# Patient Record
Sex: Female | Born: 1950 | Race: White | Hispanic: No | Marital: Single | Smoking: Never smoker
Health system: Southern US, Community
[De-identification: ages and names within clinical notes are randomized; demographics above are authoritative.]

## PROBLEM LIST (undated history)

## (undated) DIAGNOSIS — N200 Calculus of kidney: Secondary | ICD-10-CM

## (undated) HISTORY — PX: LITHOTRIPSY: SUR834

## (undated) HISTORY — PX: EYE SURGERY: SHX253

---

## 2019-03-29 ENCOUNTER — Other Ambulatory Visit: Payer: Self-pay

## 2019-03-29 ENCOUNTER — Emergency Department (HOSPITAL_COMMUNITY)
Admission: EM | Admit: 2019-03-29 | Discharge: 2019-03-29 | Disposition: A | Discharge status: Home / Self Care | Payer: Medicare Other | Attending: Emergency Medicine | Admitting: Emergency Medicine

## 2019-03-29 ENCOUNTER — Encounter (HOSPITAL_COMMUNITY): Payer: Self-pay | Admitting: Emergency Medicine

## 2019-03-29 ENCOUNTER — Emergency Department (HOSPITAL_COMMUNITY): Payer: Medicare Other

## 2019-03-29 DIAGNOSIS — M5441 Lumbago with sciatica, right side: Secondary | ICD-10-CM | POA: Diagnosis not present

## 2019-03-29 DIAGNOSIS — M545 Low back pain: Secondary | ICD-10-CM | POA: Diagnosis present

## 2019-03-29 MED ORDER — METOCLOPRAMIDE HCL 5 MG/ML IJ SOLN
10.0000 mg | Freq: Once | INTRAMUSCULAR | Status: AC
  Administered 2019-03-29: 18:00:00 10 mg via INTRAVENOUS
  Filled 2019-03-29: qty 2

## 2019-03-29 MED ORDER — MORPHINE SULFATE (PF) 4 MG/ML IV SOLN
4.0000 mg | Freq: Once | INTRAVENOUS | Status: AC
  Administered 2019-03-29: 18:00:00 4 mg via INTRAVENOUS
  Filled 2019-03-29: qty 1

## 2019-03-29 MED ORDER — PREDNISONE 10 MG (21) PO TBPK: ORAL_TABLET | ORAL | 0 refills | Status: AC

## 2019-03-29 MED ORDER — HYDROCODONE-ACETAMINOPHEN 5-325 MG PO TABS: 1.0000 | ORAL_TABLET | Freq: Four times a day (QID) | ORAL | 0 refills | Status: AC | PRN

## 2019-03-29 MED ORDER — LIDOCAINE 5 % EX PTCH: 1.0000 | MEDICATED_PATCH | CUTANEOUS | 0 refills | Status: AC

## 2019-03-29 MED ORDER — METHYLPREDNISOLONE SODIUM SUCC 125 MG IJ SOLR
80.0000 mg | Freq: Once | INTRAMUSCULAR | Status: AC
  Administered 2019-03-29: 80 mg via INTRAVENOUS
  Filled 2019-03-29: qty 2

## 2019-03-29 MED ORDER — METHOCARBAMOL 500 MG PO TABS: 500.0000 mg | ORAL_TABLET | Freq: Two times a day (BID) | ORAL | 0 refills | Status: AC

## 2019-03-29 NOTE — Discharge Instructions (Signed)
Take it easy, but do not lay around too much as this may make any stiffness worse.  °Antiinflammatory medications: Take 600 mg of ibuprofen every 6 hours or 440 mg (over the counter dose) to 500 mg (prescription dose) of naproxen every 12 hours for the next 3 days. After this time, these medications may be used as needed for pain. Take these medications with food to avoid upset stomach. Choose only one of these medications, do not take them together. °Acetaminophen (generic for Tylenol): Should you continue to have additional pain while taking the ibuprofen or naproxen, you may add in acetaminophen as needed. Your daily total maximum amount of acetaminophen from all sources should be limited to 4000mg/day for persons without liver problems, or 2000mg/day for those with liver problems. °Vicodin: May take Vicodin (hydrocodone-acetaminophen) as needed for severe pain.   °Do not drive or perform other dangerous activities while taking this medication as it can cause drowsiness as well as changes in reaction time and judgement.   °Please note that each pill of Vicodin contains 325 mg of acetaminophen (generic for Tylenol) and the above dosage limits apply. °Methocarbamol: Methocarbamol (generic for Robaxin) is a muscle relaxer and can help relieve stiff muscles or muscle spasms.  Do not drive or perform other dangerous activities while taking this medication as it can cause drowsiness as well as changes in reaction time and judgement. °Lidocaine patches: These are available via either prescription or over-the-counter. The over-the-counter option may be more economical one and are likely just as effective. There are multiple over-the-counter brands, such as Salonpas. °Exercises: Be sure to perform the attached exercises starting with three times a week and working up to performing them daily. This is an essential part of preventing long term problems.  °Follow up: Follow up with a primary care provider for any future  management of these complaints. Be sure to follow up within 7-10 days. °Return: Return to the ED should symptoms worsen. ° °For prescription assistance, may try using prescription discount sites or apps, such as goodrx.com °

## 2019-03-29 NOTE — ED Notes (Signed)
Patient ambulated to the bathroom with steady gait. She complained of increased pain with walking but still able to ambulate.

## 2019-03-29 NOTE — ED Triage Notes (Addendum)
C/o severe lower back pain that radiates to bilateral legs since Saturday.  R leg worse than left.  States pain also radiates up to L shoulder.  Pain worse when sitting.  Pt pacing on arrival. Hypertensive.

## 2019-03-29 NOTE — ED Notes (Signed)
Pt lying on bench in waiting room and appears to be more comfortable at this time.

## 2019-03-29 NOTE — ED Provider Notes (Signed)
[facility] EMERGENCY DEPARTMENT Provider Note   CSN: [id number] Arrival date & time: 03/29/19  1522     History   Chief Complaint Chief Complaint  Patient presents with  . Back Pain  . Leg Pain    HPI Dana Keith is a 68 y.o. female.     HPI   Dana Keith is a 68 y.o. female, patient with no pertinent past medical history, presenting to the ED with back pain beginning August 21. Patient states she went running August 21, which is an activity she has not engaged in for a long time.  Later that evening, she began to feel pain in the right anterior and lateral hip, radiating to the right lower back. When she woke up the next morning, she noted much more severe pain in the right lower back, radiating into the right buttock and down the right leg. She began seeing a chiropractor on August 24.  During today's chiropractic visit, the chiropractor told her she "may have something wrong with the L5." Her pain is severe, sharp, radiating down the right leg.  She will, at times, have pain in the left lower back as well.  Denies fever/chills, abdominal pain, numbness, weakness, falls/trauma, changes in bowel or bladder function, saddle anesthesias, or any other complaints.   History reviewed. No pertinent past medical history.  There are no active problems to display for this patient.   Past Surgical History:  Procedure Laterality Date  . CESAREAN SECTION    . EYE SURGERY       OB History   No obstetric history on file.      Home Medications    Prior to Admission medications   Medication Sig Start Date End Date Taking? Authorizing Provider  HYDROcodone-acetaminophen (NORCO/VICODIN) 5-325 MG tablet Take 1-2 tablets by mouth every 6 (six) hours as needed for severe pain. 03/29/19   Rashaun Wichert C, PA-C  lidocaine (LIDODERM) 5 % Place 1 patch onto the skin daily. Remove & Discard patch within 12 hours or as directed by MD 03/29/19   Haile Bosler C, PA-C   methocarbamol (ROBAXIN) 500 MG tablet Take 1 tablet (500 mg total) by mouth 2 (two) times daily. 03/29/19   Tzippy Testerman C, PA-C  predniSONE (STERAPRED UNI-PAK 21 TAB) 10 MG (21) TBPK tablet Take 6 tabs (60mg ) day 1, 5 tabs (50mg ) day 2, 4 tabs (40mg ) day 3, 3 tabs (30mg ) day 4, 2 tabs (20mg ) day 5, and 1 tab (10mg ) day 6. 03/29/19   Brynnley Dayrit, Hillard Danker, PA-C    Family History No family history on file.  Social History Social History   Tobacco Use  . Smoking status: Never Smoker  . Smokeless tobacco: Never Used  Substance Use Topics  . Alcohol use: Yes  . Drug use: Never     Allergies   Zofran [ondansetron]   Review of Systems Review of Systems  Constitutional: Negative for chills, diaphoresis and fever.  Respiratory: Negative for shortness of breath.   Cardiovascular: Negative for chest pain.  Gastrointestinal: Negative for abdominal pain, diarrhea, nausea and vomiting.  Genitourinary: Negative for difficulty urinating.  Musculoskeletal: Positive for back pain.  Neurological: Negative for weakness and numbness.  All other systems reviewed and are negative.    Physical Exam Updated Vital Signs BP (!) 141/119 (BP Location: Right Arm)   Pulse 95   Temp 98 F (36.7 C) (Oral)   Resp (!) 22   Ht 5\' 6"  (1.676 m)  Wt 83.5 kg   SpO2 94%   BMI 29.70 kg/m   Physical Exam Vitals signs and nursing note reviewed.  Constitutional:      General: She is not in acute distress.    Appearance: She is well-developed. She is not diaphoretic.  HENT:     Head: Normocephalic and atraumatic.     Mouth/Throat:     Mouth: Mucous membranes are moist.     Pharynx: Oropharynx is clear.  Eyes:     Conjunctiva/sclera: Conjunctivae normal.  Neck:     Musculoskeletal: Neck supple.  Cardiovascular:     Rate and Rhythm: Normal rate and regular rhythm.     Pulses: Normal pulses.          Radial pulses are 2+ on the right side and 2+ on the left side.       Posterior tibial pulses are 2+ on the  right side and 2+ on the left side.     Comments: Tactile temperature in the extremities appropriate and equal bilaterally. Pulmonary:     Effort: Pulmonary effort is normal. No respiratory distress.  Abdominal:     Palpations: Abdomen is soft.     Tenderness: There is no abdominal tenderness. There is no guarding.  Musculoskeletal:       Back:     Right lower leg: No edema.     Left lower leg: No edema.     Comments: Tenderness in the right buttock without noted deformity, swelling, or instability.  Some tenderness into the right lumbar musculature. No tenderness to the left lower back, left hip, or left leg. Patient is able to readily move the right upper and lower leg.  No pain or noted difficulty with range of motion in the right hip.  Normal motor function intact in all extremities. No midline spinal tenderness.    Lymphadenopathy:     Cervical: No cervical adenopathy.  Skin:    General: Skin is warm and dry.  Neurological:     Mental Status: She is alert.     Comments: Sensation to light touch grossly intact in the bilateral lower extremities. Strength 5/5 in the bilateral hips.   Strength 5/5 in the bilateral knees. Strength 5/5 in the bilateral ankles. No saddle anesthesias. Coordination intact with heel-to-shin testing. Patient does have somewhat of been antalgic gait, seemingly avoiding pressure on the right leg.   However, she was also observed to put all her weight on first the left leg, then the right leg when adjusting her pants after getting dressed at the end of the visit.  Psychiatric:        Mood and Affect: Mood and affect normal.        Speech: Speech normal.        Behavior: Behavior normal.      ED Treatments / Results  Labs (all labs ordered are listed, but only abnormal results are displayed) Labs Reviewed - No data to display  EKG EKG Interpretation  Date/Time:  Wednesday March 29 2019 16:56:24 EDT Ventricular Rate:  61 PR Interval:    QRS  Duration: 99 QT Interval:  394 QTC Calculation: 397 R Axis:   84 Text Interpretation:  Sinus rhythm Borderline right axis deviation No old tracing to compare Confirmed by Meridee ScoreButler, Michael [id number]) on 03/29/2019 4:57:48 PM   Radiology Dg Lumbar Spine Complete  Result Date: 03/29/2019 CLINICAL DATA:  68 year old female with lumbar spine pain radiating into the right hip for the past 5 days. No known  injury. EXAM: LUMBAR SPINE - COMPLETE 4+ VIEW COMPARISON:  Concurrently obtained radiographs of the left hip FINDINGS: No evidence of acute fracture or malalignment. Mild multilevel degenerative disc disease with slight disc space narrowing at L2-L3 and L5-S1. Moderate sclerosis and hypertrophy of the facets at L3-L4, L4-L5 and L5-S1. No lytic or blastic osseous lesion. The visualized bowel gas pattern is normal. IMPRESSION: 1. Moderate lower lumbar facet arthropathy. 2. Mild multilevel degenerative disc disease. 3. No evidence of acute fracture, malalignment or osseous lesion. Electronically Signed   By: Jacqulynn Cadet M.D.   On: 03/29/2019 18:40   Dg Hip Unilat W Or Wo Pelvis 2-3 Views Right  Result Date: 03/29/2019 CLINICAL DATA:  Low back and hip pain EXAM: DG HIP (WITH OR WITHOUT PELVIS) 2-3V RIGHT COMPARISON:  None. FINDINGS: There is no evidence of hip fracture or dislocation. There is no evidence of arthropathy or other focal bone abnormality. IMPRESSION: Negative. Electronically Signed   By: Rolm Baptise M.D.   On: 03/29/2019 18:36    Procedures Procedures (including critical care time)  Medications Ordered in ED Medications  metoCLOPramide (REGLAN) injection 10 mg (10 mg Intravenous Given 03/29/19 1734)  morphine 4 MG/ML injection 4 mg (4 mg Intravenous Given 03/29/19 1734)  methylPREDNISolone sodium succinate (SOLU-MEDROL) 125 mg/2 mL injection 80 mg (80 mg Intravenous Given 03/29/19 1942)     Initial Impression / Assessment and Plan / ED Course  I have reviewed the triage vital signs  and the nursing notes.  Pertinent labs & imaging results that were available during my care of the patient were reviewed by me and considered in my medical decision making (see chart for details).  Clinical Course as of Mar 28 2341  Wed Mar 29, 2019  1825 Patient states her pain has significantly improved.   [SJ]  159 68 year old female with no significant past medical history here with back and bilateral leg pain right worse than left since Saturday after taking up running.  She has been working with a Restaurant manager, fast food with no improvement in her symptoms.  No numbness or weakness.  No bowel or bladder incontinence.  She is gotten some pain medicine and is getting plain film imaging of her back and her pelvis hip.  Likely discharge on NSAIDs and possibly steroids with close follow-up with primary care or sports medicine   [MB]    Clinical Course User Index [MB] Hayden Rasmussen, MD [SJ] Lorayne Bender, PA-C       Patient presents with lower back pain after taking up running again following several months of hiatus.  No evidence of neurovascular compromise.  No red flag symptoms.  Orthopedic follow-up. The patient was given instructions for home care as well as return precautions. Patient voices understanding of these instructions, accepts the plan, and is comfortable with discharge.   Findings and plan of care discussed with Ronnald Ramp, MD. Dr. Melina Copa personally evaluated and examined this patient.  Final Clinical Impressions(s) / ED Diagnoses   Final diagnoses:  Acute right-sided low back pain with right-sided sciatica    ED Discharge Orders         Ordered    methocarbamol (ROBAXIN) 500 MG tablet  2 times daily     03/29/19 1902    HYDROcodone-acetaminophen (NORCO/VICODIN) 5-325 MG tablet  Every 6 hours PRN     03/29/19 1902    lidocaine (LIDODERM) 5 %  Every 24 hours     03/29/19 1902    predniSONE (STERAPRED UNI-PAK 21  TAB) 10 MG (21) TBPK tablet     03/29/19 1902            Concepcion LivingJoy, Dollie Mayse C, PA-C 03/29/19 2344    Terrilee FilesButler, Michael C, MD 03/30/19 1321

## 2019-06-27 ENCOUNTER — Other Ambulatory Visit: Payer: Self-pay

## 2019-06-27 ENCOUNTER — Emergency Department (HOSPITAL_BASED_OUTPATIENT_CLINIC_OR_DEPARTMENT_OTHER): Payer: Medicare Other

## 2019-06-27 ENCOUNTER — Encounter (HOSPITAL_BASED_OUTPATIENT_CLINIC_OR_DEPARTMENT_OTHER): Payer: Self-pay | Admitting: Emergency Medicine

## 2019-06-27 ENCOUNTER — Emergency Department (HOSPITAL_BASED_OUTPATIENT_CLINIC_OR_DEPARTMENT_OTHER)
Admission: EM | Admit: 2019-06-27 | Discharge: 2019-06-27 | Disposition: A | Discharge status: Home / Self Care | Payer: Medicare Other | Attending: Emergency Medicine | Admitting: Emergency Medicine

## 2019-06-27 DIAGNOSIS — Z79899 Other long term (current) drug therapy: Secondary | ICD-10-CM | POA: Insufficient documentation

## 2019-06-27 DIAGNOSIS — N3001 Acute cystitis with hematuria: Secondary | ICD-10-CM | POA: Insufficient documentation

## 2019-06-27 DIAGNOSIS — R1011 Right upper quadrant pain: Secondary | ICD-10-CM | POA: Diagnosis present

## 2019-06-27 HISTORY — DX: Calculus of kidney: N20.0

## 2019-06-27 LAB — URINALYSIS, MICROSCOPIC (REFLEX)
RBC / HPF: >50 RBC/hpf (ref 0–5)
WBC, UA: >50 WBC/hpf (ref 0–5)

## 2019-06-27 LAB — URINALYSIS, ROUTINE W REFLEX MICROSCOPIC
Bilirubin Urine: NEGATIVE
Glucose, UA: NEGATIVE mg/dL
Ketones, ur: NEGATIVE mg/dL
Nitrite: NEGATIVE
Protein, ur: 100 mg/dL — AB
Specific Gravity, Urine: 1.025 (ref 1.005–1.030)
pH: 5.5 (ref 5.0–8.0)

## 2019-06-27 MED ORDER — CEPHALEXIN 250 MG PO CAPS
500.0000 mg | ORAL_CAPSULE | Freq: Once | ORAL | Status: AC
  Administered 2019-06-27: 09:00:00 500 mg via ORAL
  Filled 2019-06-27: qty 2

## 2019-06-27 MED ORDER — CEPHALEXIN 500 MG PO CAPS: 500.0000 mg | ORAL_CAPSULE | Freq: Two times a day (BID) | ORAL | 0 refills | Status: AC

## 2019-06-27 MED FILL — CEPHALEXIN 500 MG CAPSULE: 500 | 7 days supply | Qty: 14 | Fill #0

## 2019-06-27 NOTE — ED Triage Notes (Signed)
Woke up with left flank pain and frequency with urination. Denies dysuria. Nausea since last night

## 2019-06-27 NOTE — ED Notes (Signed)
ED Provider at bedside. 

## 2019-06-27 NOTE — ED Provider Notes (Signed)
[facility] EMERGENCY DEPARTMENT Provider Note   CSN: [id number] Arrival date & time: 06/27/19  6295     History   Chief Complaint Right flank pain, urinary frequency  HPI Dana Keith is a 68 y.o. female past medical history of kidney stones, ruptured lumbar disc, presenting to the emergency department with right-sided flank pain and urinary hesitation.  The patient reports that she developed some mild cramping type pain in her right lower back which radiates down towards the thigh and her groin, yesterday.  She states she had multiple episodes of urinary urgency last night, and reports that a little urine was coming out at a time, which she believes may be a sign of a kidney stone.  She states her last episode of kidney stone was probably 10 to 15 years ago.  She states that in the past she has needed surgical intervention to break up her stones as they were too large to pass spontaneously.  She believes that these current symptoms are consistent with the early onset of her prior symptoms.  She denies any nausea, vomiting, cough congestion or URI symptoms.  She denies any numbness or weakness in her lower extremities.  She denies any saddle anesthesia.  She denies any falls.  She denies any trauma to the back.  She denies any history of IV drug use.  She was in the [facility] ER in August 2020 with significant back pain and bilateral sciatica.  She states had an MRI performed which showed a ruptured disc.  She states her symptoms have since resolved.  She does not believe that her current symptoms feel anything like those symptoms.  Allergies to zofran She reports no additional medical problems, and takes no medications at baseline  PSHx:  Hysterectomy x 2   HPI  Past Medical History:  Diagnosis Date  . Kidney calculi     There are no active problems to display for this patient.   Past Surgical History:  Procedure Laterality Date  . CESAREAN SECTION    . EYE SURGERY     . LITHOTRIPSY       OB History   No obstetric history on file.      Home Medications    Prior to Admission medications   Medication Sig Start Date End Date Taking? Authorizing Provider  cephALEXin (KEFLEX) 500 MG capsule Take 1 capsule (500 mg total) by mouth 2 (two) times daily for 7 days. 06/27/19 07/04/19  Wyvonnia Dusky, MD  HYDROcodone-acetaminophen (NORCO/VICODIN) 5-325 MG tablet Take 1-2 tablets by mouth every 6 (six) hours as needed for severe pain. 03/29/19   Joy, Shawn C, PA-C  lidocaine (LIDODERM) 5 % Place 1 patch onto the skin daily. Remove & Discard patch within 12 hours or as directed by MD 03/29/19   Joy, Shawn C, PA-C  methocarbamol (ROBAXIN) 500 MG tablet Take 1 tablet (500 mg total) by mouth 2 (two) times daily. 03/29/19   Joy, Shawn C, PA-C  predniSONE (STERAPRED UNI-PAK 21 TAB) 10 MG (21) TBPK tablet Take 6 tabs (60mg ) day 1, 5 tabs (50mg ) day 2, 4 tabs (40mg ) day 3, 3 tabs (30mg ) day 4, 2 tabs (20mg ) day 5, and 1 tab (10mg ) day 6. 03/29/19   Joy, Helane Gunther, PA-C    Family History No family history on file.  Social History Social History   Tobacco Use  . Smoking status: Never Smoker  . Smokeless tobacco: Never Used  Substance Use Topics  . Alcohol use: Yes  .  Drug use: Never     Allergies   Zofran [ondansetron]   Review of Systems Review of Systems  Constitutional: Negative for chills and fever.  Respiratory: Negative for cough and shortness of breath.   Gastrointestinal: Negative for abdominal pain, nausea and vomiting.  Genitourinary: Positive for difficulty urinating and flank pain. Negative for dysuria and hematuria.  Musculoskeletal: Positive for back pain. Negative for arthralgias.  Neurological: Negative for weakness and numbness.  Psychiatric/Behavioral: Negative for agitation and confusion.  All other systems reviewed and are negative.    Physical Exam Updated Vital Signs BP (!) 155/88 (BP Location: Right Arm)   Pulse 78   Temp 98 F  (36.7 C) (Oral)   Resp 20   Ht 5\' 6"  (1.676 m)   Wt 83.6 kg   SpO2 98%   BMI 29.75 kg/m   Physical Exam Vitals signs and nursing note reviewed.  Constitutional:      General: She is not in acute distress.    Appearance: She is well-developed.  HENT:     Head: Normocephalic and atraumatic.  Eyes:     Conjunctiva/sclera: Conjunctivae normal.  Neck:     Musculoskeletal: Neck supple.  Cardiovascular:     Rate and Rhythm: Normal rate and regular rhythm.     Pulses: Normal pulses.  Pulmonary:     Effort: Pulmonary effort is normal. No respiratory distress.     Breath sounds: Normal breath sounds.  Abdominal:     Palpations: Abdomen is soft.     Tenderness: There is no abdominal tenderness. Negative signs include McBurney's sign.     Comments: No significant flank tenderness on exam  Skin:    General: Skin is warm and dry.  Neurological:     General: No focal deficit present.     Mental Status: She is alert and oriented to person, place, and time.     Motor: No weakness.     Gait: Gait normal.     Comments: Negative straight leg test bilaterally No spinal midline tenderness  Psychiatric:        Mood and Affect: Mood normal.        Behavior: Behavior normal.      ED Treatments / Results  Labs (all labs ordered are listed, but only abnormal results are displayed) Labs Reviewed  URINALYSIS, ROUTINE W REFLEX MICROSCOPIC - Abnormal; Notable for the following components:      Result Value   APPearance CLOUDY (*)    Hgb urine dipstick LARGE (*)    Protein, ur 100 (*)    Leukocytes,Ua MODERATE (*)    All other components within normal limits  URINALYSIS, MICROSCOPIC (REFLEX) - Abnormal; Notable for the following components:   Bacteria, UA MANY (*)    All other components within normal limits  URINE CULTURE    EKG None  Radiology Ct Renal Stone Study  Result Date: 06/27/2019 CLINICAL DATA:  Left flank pain and nausea. EXAM: CT ABDOMEN AND PELVIS WITHOUT CONTRAST  TECHNIQUE: Multidetector CT imaging of the abdomen and pelvis was performed following the standard protocol without IV contrast. COMPARISON:  None. FINDINGS: Lower chest: Normal. Hepatobiliary: No focal liver abnormality is seen. No gallstones, gallbladder wall thickening, or biliary dilatation. Pancreas: Unremarkable. No pancreatic ductal dilatation or surrounding inflammatory changes. Spleen: Normal in size without focal abnormality. Adrenals/Urinary Tract: Several tiny calcifications in the left kidney. Tiny calcification in the right mid kidney. 8 mm stone in the lower pole of the right kidney. No hydronephrosis. No ureteral  calculi. Bladder is normal. Adrenal glands are normal. Stomach/Bowel: Stomach is within normal limits. Appendix appears normal. No evidence of bowel wall thickening, distention, or inflammatory changes. Vascular/Lymphatic: Aortic atherosclerosis. No enlarged abdominal or pelvic lymph nodes. Reproductive: Uterus and bilateral adnexa are unremarkable. Other: No abdominal wall hernia or abnormality. No abdominopelvic ascites. Musculoskeletal: Prominent facet arthritis at L4-5 bilaterally and to a lesser degree at L3-4. Degenerative disc disease at L5-S1. Slight arthritic changes of the anterior aspect of the acetabulum of the left hip. IMPRESSION: 1. No acute abnormalities of the abdomen or pelvis. 2. Bilateral renal calculi. 3. Aortic Atherosclerosis (ICD10-I70.0). Electronically Signed   By: Francene BoyersJames  Maxwell M.D.   On: 06/27/2019 08:11    Procedures Procedures (including critical care time)  Medications Ordered in ED Medications  cephALEXin (KEFLEX) capsule 500 mg (500 mg Oral Given 06/27/19 0843)     Initial Impression / Assessment and Plan / ED Course  I have reviewed the triage vital signs and the nursing notes.  Pertinent labs & imaging results that were available during my care of the patient were reviewed by me and considered in my medical decision making (see chart for  details).  68 year old female with a history of kidney stones presenting with right-sided flank discomfort and urinary frequency beginning last night.  She believes her symptoms may be consistent with a kidney stone.  She is otherwise well-appearing on exam and is not in significant distress.  However, given her reported prior history of needing intervention for stone removal, we will obtain CT abd renal study.  Will also check UA.  Lower suspicion for pyelonephritis or sepsis at this time.  At this time, the lower suspicion for cord compression or cauda equina syndrome.  She has had no recent falls or trauma to lead to worsening spinal injury.  She has no weakness in my exam.  She has no saddle anesthesia.  Clinical Course as of Jun 26 942  Tue Jun 27, 2019  [id number] Hgb urine dipstick(!): LARGE [MT]  [id number] Suspect UTI as likely cause - cannot exclude early stone passage/ureteral spasm.  Less likely cord compression at this time, but discussed return precautions if she develops other signs of cord compression, and to f/u with her PCP.  Pt verbalized understanding   [MT]    Clinical Course User Index [MT] Terald Sleeperrifan, Clayvon Parlett J, MD   Final Clinical Impressions(s) / ED Diagnoses   Final diagnoses:  Acute cystitis with hematuria    ED Discharge Orders         Ordered    cephALEXin (KEFLEX) 500 MG capsule  2 times daily     06/27/19 0837           Terald Sleeperrifan, Cendy Oconnor J, MD 06/27/19 [id number]

## 2019-06-27 NOTE — Discharge Instructions (Signed)
As we discussed, you very likely have a urine infection.  Please complete the entire course of antibiotics.  I expect her urinary symptoms will improve at that point.  You have an 8 mm kidney stone sitting in the kidney on the right side.  It does not appear that you are passing a stone at this time.  Please be aware that at some point in the future, you may pass a stone.  However many people go their entire lives without passing a kidney stone.  Finally, as we discussed, please be on the look out for the red flags of spinal cord injury.  This includes numbness around your groin, vagina or anus.  This includes inability to control your bowel or bladder.  This includes numbness or weakness in your legs, including falls or sudden inability to walk well.

## 2019-06-27 NOTE — ED Notes (Signed)
Patient transported to CT 

## 2019-06-27 NOTE — ED Notes (Signed)
Given po fluids 

## 2019-06-29 LAB — URINE CULTURE
Culture: >=100000 — AB
Special Requests: NORMAL

## 2019-06-30 ENCOUNTER — Telehealth: Payer: Self-pay | Admitting: *Deleted

## 2019-06-30 NOTE — Telephone Encounter (Signed)
Post ED Visit - Positive Culture Follow-up  Culture report reviewed by antimicrobial stewardship pharmacist: [facility] Team []  Elenor Quinones, Pharm.D. []  Heide Guile, Pharm.D., BCPS AQ-ID []  Parks Neptune, Pharm.D., BCPS []  Alycia Rossetti, Pharm.D., BCPS []  G[place], Pharm.D., BCPS, AAHIVP []  Legrand Como, Pharm.D., BCPS, AAHIVP [x]  Salome Arnt, PharmD, BCPS []  Johnnette Gourd, PharmD, BCPS []  Hughes Better, PharmD, BCPS []  Leeroy Cha, PharmD []  Laqueta Linden, PharmD, BCPS []  Albertina Parr, PharmD  [facility] Team []  Leodis Sias, PharmD []  Lindell Spar, PharmD []  Royetta Asal, PharmD []  Graylin Shiver, Rph []  Rema Fendt) Glennon Mac, PharmD []  Arlyn Dunning, PharmD []  Netta Cedars, PharmD []  Dia Sitter, PharmD []  Leone Haven, PharmD []  Gretta Arab, PharmD []  Theodis Shove, PharmD []  Peggyann Juba, PharmD []  Reuel Boom, PharmD   Positive urine culture Treated with Cephalexin, organism sensitive to the same and no further patient follow-up is required at this time.  Harlon Flor Talley 06/30/2019, 10:10 AM

## 2019-07-17 ENCOUNTER — Telehealth: Payer: Self-pay | Admitting: Orthopedic Surgery

## 2019-07-17 NOTE — Telephone Encounter (Signed)
Appt 12/16 will try to reach out to patient regarding questions/concerns this afternoon.

## 2019-07-17 NOTE — Telephone Encounter (Signed)
Tried calling patient. No answer. LMVM advising was returning her call to discuss concerns/questions she has about her appt.

## 2019-07-17 NOTE — Telephone Encounter (Signed)
Patient called. She has questions about her upcoming appointment and would like to speak to a nurse. Her call back number is [phone]

## 2019-07-17 NOTE — Telephone Encounter (Signed)
Patient called. Says she missed a call from [place]. Would like a call back.

## 2019-07-18 ENCOUNTER — Telehealth: Payer: Self-pay | Admitting: Orthopedic Surgery

## 2019-07-18 NOTE — Telephone Encounter (Signed)
Patient called and stated that she needs to speak back to you.  Please call patient @ [phone]

## 2019-07-18 NOTE — Telephone Encounter (Signed)
IC s/w patient She would like to know if Dr Marlou Sa uses robotic assist in his TKA surgeries. She stated this is something that she is interested in. I advised I would try to speak further with Dr Marlou Sa and call her back to advise.

## 2019-07-18 NOTE — Telephone Encounter (Signed)
Called and discussed with patient

## 2019-07-18 NOTE — Telephone Encounter (Signed)
Called patient, no answer. LMVM advising was returning her call.

## 2019-07-18 NOTE — Telephone Encounter (Signed)
I tried calling patient. No answer.  LM for her advising was returning her call to discuss questions/concerns. Advised her on VM to please leave further details with whomever answers regarding whatever questions she has.

## 2019-07-18 NOTE — Telephone Encounter (Signed)
Patient called. Missed a call from [place]. Would like a call back. Her number is [phone]

## 2019-07-19 ENCOUNTER — Ambulatory Visit: Payer: Self-pay

## 2019-07-19 ENCOUNTER — Ambulatory Visit (INDEPENDENT_AMBULATORY_CARE_PROVIDER_SITE_OTHER): Payer: Medicare Other

## 2019-07-19 ENCOUNTER — Other Ambulatory Visit: Payer: Self-pay

## 2019-07-19 ENCOUNTER — Ambulatory Visit (INDEPENDENT_AMBULATORY_CARE_PROVIDER_SITE_OTHER): Payer: Medicare Other | Admitting: Orthopedic Surgery

## 2019-07-19 DIAGNOSIS — M25561 Pain in right knee: Secondary | ICD-10-CM | POA: Diagnosis not present

## 2019-07-19 DIAGNOSIS — G8929 Other chronic pain: Secondary | ICD-10-CM

## 2019-07-19 DIAGNOSIS — M25562 Pain in left knee: Secondary | ICD-10-CM

## 2019-07-20 ENCOUNTER — Encounter: Payer: Self-pay | Admitting: Orthopedic Surgery

## 2019-07-20 NOTE — Progress Notes (Signed)
Office Visit Note   Patient: Dana Keith           Date of Birth: 02/24/1951           MRN: [id number] Visit Date: 07/19/2019 Requested by: No referring provider defined for this encounter. PCP: Patient, No Pcp Per  Subjective: Chief Complaint  Patient presents with  . Right Knee - Pain    HPI: Dana Keith is a patient with left knee pain.  She has had plain radiographs as well as MRI scan.  Those are reviewed.  She reports chronic pain with swelling weakness and giving way along with popping and locking.  The pain will wake her from sleep at night.  She is trying to get relief from a chiropractor with laser treatment.  She is also using organic oil to help with the pain.  She has had pain for years.  She did do some running the last few years but has been unable to do that.  Does not take much in the way of medication for the problem.  She does not want any injections and she does not really want to take medication.  She currently is not working.  She lives alone but has some support network around within the community but she would prefer not to use them if possible.  She is definitive on wanting to have robotic assisted surgery performed on her knee.              ROS: All systems reviewed are negative as they relate to the chief complaint within the history of present illness.  Patient denies  fevers or chills.   Assessment & Plan: Visit Diagnoses:  1. Chronic pain of both knees     Plan: Impression is left knee arthritis and symptoms consistent with need for knee replacement.  She does have end-stage arthritis in the medial compartment and to a lesser degree in the patellofemoral compartment of that left knee.  She wants to have her surgery done by a surgeon that is trained in robotic assisted surgery.  I will refer her to Dr. Perry Mount in [place] for that procedure follow-up with [place] as needed.  We are working at [facility] on obtaining that [facility] device.  Follow-Up Instructions: Return if  symptoms worsen or fail to improve.   Orders:  Orders Placed This Encounter  Procedures  . XR KNEE 3 VIEW RIGHT  . XR KNEE 3 VIEW LEFT  . Ambulatory referral to Orthopedic Surgery   No orders of the defined types were placed in this encounter.     Procedures: No procedures performed   Clinical Data: No additional findings.  Objective: Vital Signs: There were no vitals taken for this visit.  Physical Exam:   Constitutional: Patient appears well-developed HEENT:  Head: Normocephalic Eyes:EOM are normal Neck: Normal range of motion Cardiovascular: Normal rate Pulmonary/chest: Effort normal Neurologic: Patient is alert Skin: Skin is warm Psychiatric: Patient has normal mood and affect    Ortho Exam: Ortho exam demonstrates slight varus alignment left leg.  Pedal pulses palpable bilaterally.  Patient has full extension and flexion easily past 90 in both knees.  Medial greater than lateral joint line tenderness in the left knee.  Mild patellofemoral crepitus is present.  No groin pain with internal X rotation of the leg.  Collateral cruciate ligaments are stable bilaterally.  Specialty Comments:  No specialty comments available.  Imaging: XR KNEE 3 VIEW LEFT  Result Date: 07/20/2019 AP lateral merchant left knee  reviewed.  Medial compartment arthritis and varus alignment is present.  Lateral compartment appears to be spared.  Patellofemoral arthritis also noted in the left knee.  XR KNEE 3 VIEW RIGHT  Result Date: 07/20/2019 AP lateral merchant right knee reviewed.  Joint spaces maintained in the medial lateral compartments.  Mild patellofemoral joint arthritis is present.  No acute fracture.    PMFS History: There are no problems to display for this patient.  Past Medical History:  Diagnosis Date  . Kidney calculi     No family history on file.  Past Surgical History:  Procedure Laterality Date  . CESAREAN SECTION    . EYE SURGERY    . LITHOTRIPSY      Social History   Occupational History  . Not on file  Tobacco Use  . Smoking status: Never Smoker  . Smokeless tobacco: Never Used  Substance and Sexual Activity  . Alcohol use: Yes  . Drug use: Never  . Sexual activity: Not on file

## 2019-11-14 IMAGING — DX DG HIP (WITH OR WITHOUT PELVIS) 2-3V RIGHT
3 series · 3 of 3 positions shown · non-contrast
Comparison: None.

CLINICAL DATA: Low back and hip pain

EXAM:
DG HIP (WITH OR WITHOUT PELVIS) 2-3V RIGHT

[t pelvis ap]
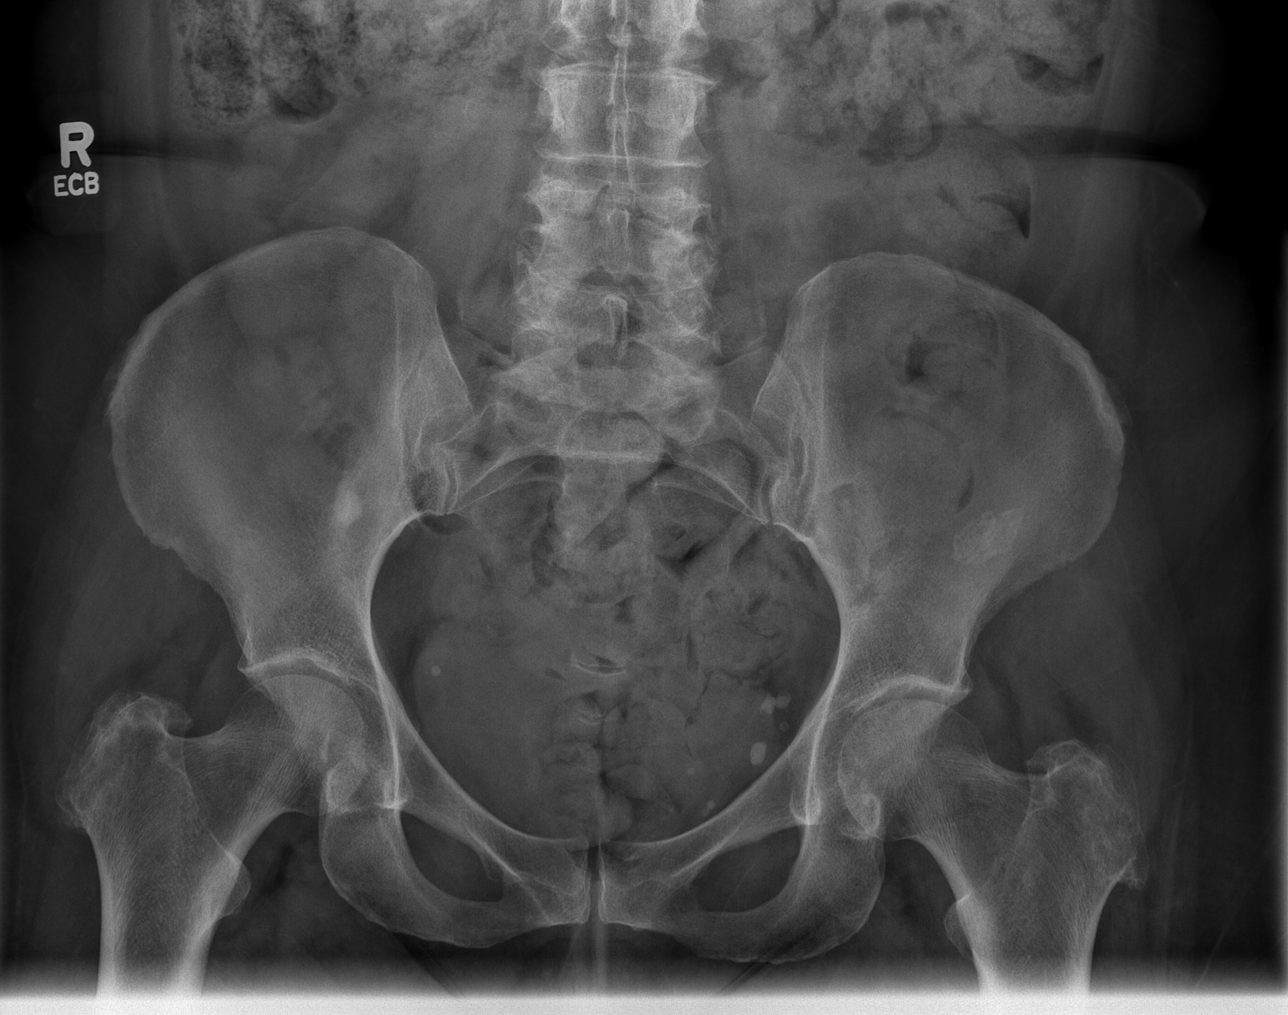

[t hip ap right]
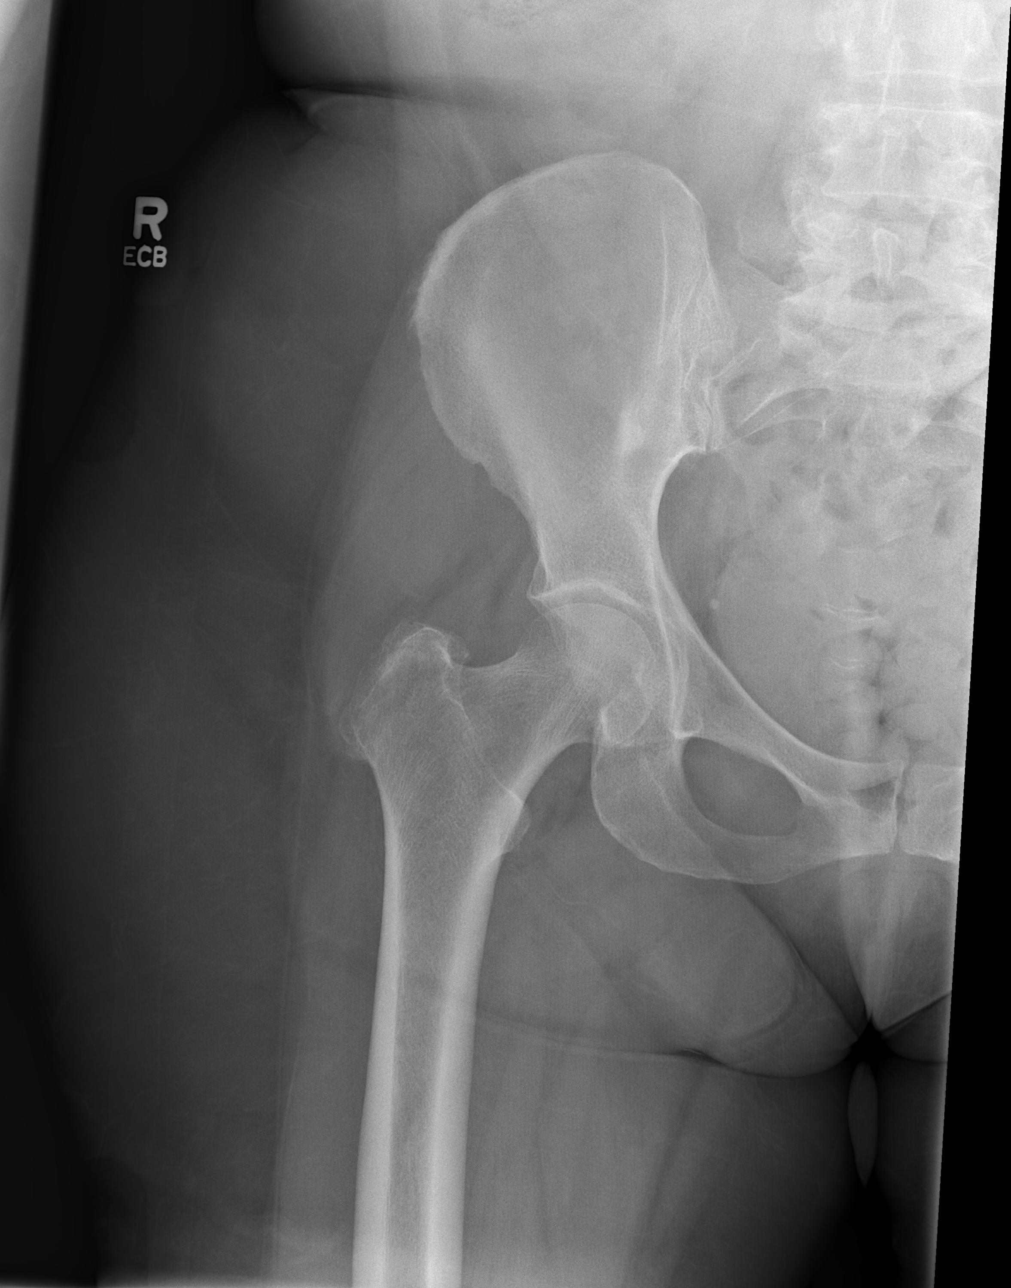

[t hip frog leg right]
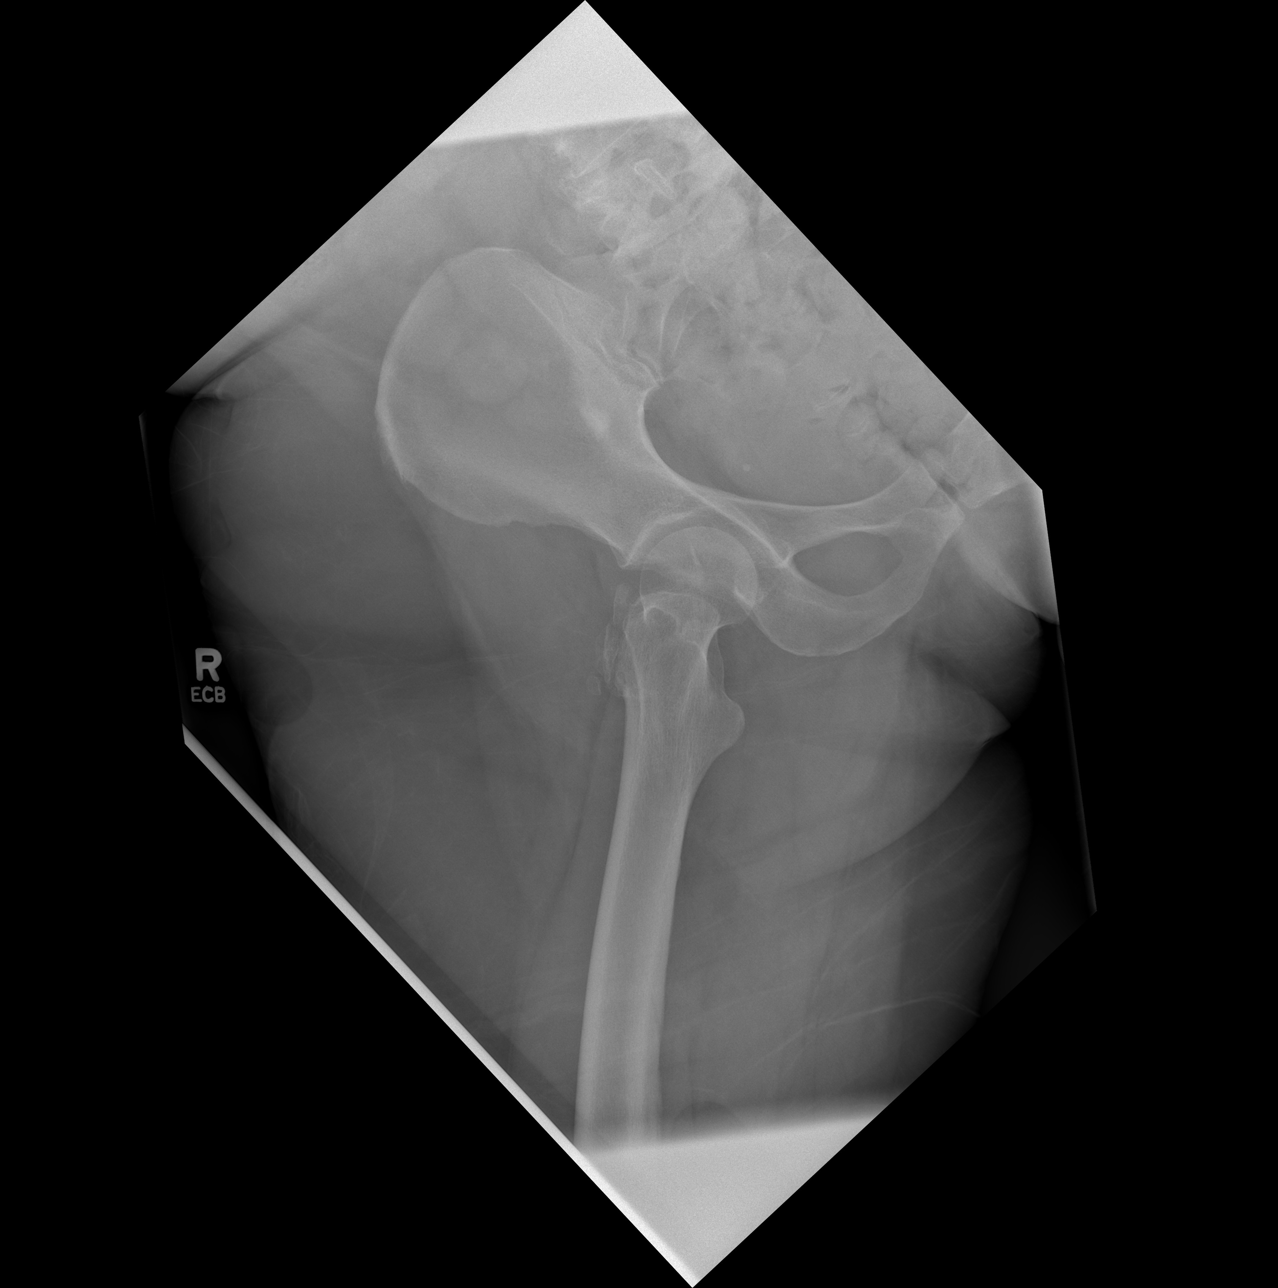

[3 of 3 positions shown; findings below may reference images not displayed]

FINDINGS: There is no evidence of hip fracture or dislocation. There is no
evidence of arthropathy or other focal bone abnormality.
IMPRESSION: Negative.
